# Patient Record
Sex: Male | Born: 1990 | Race: White | Hispanic: No | Marital: Single | Smoking: Never smoker
Health system: Southern US, Community
[De-identification: ages and names within clinical notes are randomized; demographics above are authoritative.]

## PROBLEM LIST (undated history)

## (undated) DIAGNOSIS — J45909 Unspecified asthma, uncomplicated: Secondary | ICD-10-CM

---

## 2015-03-21 ENCOUNTER — Encounter: Payer: Self-pay | Admitting: Emergency Medicine

## 2015-03-21 ENCOUNTER — Emergency Department
Admission: EM | Admit: 2015-03-21 | Discharge: 2015-03-21 | Disposition: A | Discharge status: Home / Self Care | Payer: BLUE CROSS/BLUE SHIELD | Attending: Emergency Medicine | Admitting: Emergency Medicine

## 2015-03-21 ENCOUNTER — Emergency Department: Payer: BLUE CROSS/BLUE SHIELD

## 2015-03-21 DIAGNOSIS — R509 Fever, unspecified: Secondary | ICD-10-CM | POA: Insufficient documentation

## 2015-03-21 DIAGNOSIS — R410 Disorientation, unspecified: Secondary | ICD-10-CM | POA: Insufficient documentation

## 2015-03-21 DIAGNOSIS — R519 Headache, unspecified: Secondary | ICD-10-CM

## 2015-03-21 DIAGNOSIS — R51 Headache: Secondary | ICD-10-CM | POA: Diagnosis present

## 2015-03-21 DIAGNOSIS — M542 Cervicalgia: Secondary | ICD-10-CM | POA: Diagnosis not present

## 2015-03-21 HISTORY — DX: Unspecified asthma, uncomplicated: J45.909

## 2015-03-21 LAB — CBC WITH DIFFERENTIAL/PLATELET
BASOS PCT: 1 %
Basophils Absolute: 0 10*3/uL (ref 0–0.1)
EOS ABS: 0 10*3/uL (ref 0–0.7)
EOS PCT: 1 %
HCT: 48.8 % (ref 40.0–52.0)
Hemoglobin: 16.8 g/dL (ref 13.0–18.0)
LYMPHS ABS: 1.3 10*3/uL (ref 1.0–3.6)
Lymphocytes Relative: 23 %
MCH: 29 pg (ref 26.0–34.0)
MCHC: 34.3 g/dL (ref 32.0–36.0)
MCV: 84.4 fL (ref 80.0–100.0)
MONOS PCT: 10 %
Monocytes Absolute: 0.5 10*3/uL (ref 0.2–1.0)
NEUTROS PCT: 67 %
Neutro Abs: 3.7 10*3/uL (ref 1.4–6.5)
PLATELETS: 198 10*3/uL (ref 150–440)
RBC: 5.78 MIL/uL (ref 4.40–5.90)
RDW: 12.8 % (ref 11.5–14.5)
WBC: 5.6 10*3/uL (ref 3.8–10.6)

## 2015-03-21 LAB — BASIC METABOLIC PANEL
Anion gap: 9 (ref 5–15)
BUN: 12 mg/dL (ref 6–20)
CO2: 27 mmol/L (ref 22–32)
CREATININE: 1.06 mg/dL (ref 0.61–1.24)
Calcium: 9.6 mg/dL (ref 8.9–10.3)
Chloride: 101 mmol/L (ref 101–111)
GFR calc non Af Amer: >60 mL/min (ref >60)
Glucose, Bld: 95 mg/dL (ref 65–99)
Potassium: 4.1 mmol/L (ref 3.5–5.1)
SODIUM: 137 mmol/L (ref 135–145)

## 2015-03-21 MED ORDER — BUTALBITAL-APAP-CAFFEINE 50-325-40 MG PO TABS: 1.0000 | ORAL_TABLET | Freq: Four times a day (QID) | ORAL | Status: AC | PRN

## 2015-03-21 MED ORDER — KETOROLAC TROMETHAMINE 30 MG/ML IJ SOLN
30.0000 mg | Freq: Once | INTRAMUSCULAR | Status: AC
  Administered 2015-03-21: 30 mg via INTRAVENOUS
  Filled 2015-03-21: qty 1

## 2015-03-21 NOTE — ED Notes (Addendum)
Presents with headache and some confusion  Sensitivity to light and soreness in neck  States sx's started about 3 days ago  Possible fever at that time. Denies any n/v/d   Pt is able to turn head from side to side and good flexion/extension noted

## 2015-03-21 NOTE — Discharge Instructions (Signed)

## 2015-03-21 NOTE — ED Provider Notes (Signed)
[facility] Emergency Department Provider Note  ____________________________________________  Time seen: Approximately 3:42 PM  I have reviewed the triage vital signs and the nursing notes.   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 Patient states he has some sensitivity to light soreness and neck. Symptoms started approximately 3 days ago with a possible fever at that time. There is been a recent case of viral meningitis at the university. Denies any nausea vomiting diarrhea. States that he can move his head side-to-side with good flexion and extension of his neck feels "sore when he turns laterally..Feels different than any headache he's ever had before. He described his pain as an 8/10 2 days ago at 6/10 right now. Is not requesting pain medications at this time.   Past Medical History  Diagnosis Date  . Asthma     There are no active problems to display for this patient.   History reviewed. No pertinent past surgical history.  Current Outpatient Rx  Name  Route  Sig  Dispense  Refill  . butalbital-acetaminophen-caffeine (FIORICET) 50-325-40 MG tablet   Oral   Take 1-2 tablets by mouth every 6 (six) hours as needed for headache.   20 tablet   0     Allergies Review of patient's allergies indicates no known allergies.  No family history on file.  Social History Social History  Substance Use Topics  . Smoking status: Never Smoker   . Smokeless tobacco: None  . Alcohol Use: Yes    Review of Systems Constitutional: Positive subjective fever/chills 2 days ago Eyes: No visual changes, has photophobia ENT: No sore throat. Cardiovascular: Denies chest pain. Respiratory: Denies shortness of breath. Gastrointestinal: No abdominal pain.  No nausea, no vomiting.  No diarrhea.  No constipation. Genitourinary: Negative for  dysuria. Musculoskeletal: Negative for back pain. Positive for neck pain Skin: Negative for rash. Neurological: Positive for headaches, .  10-point ROS otherwise negative.  ____________________________________________   PHYSICAL EXAM:  VITAL SIGNS: ED Triage Vitals  Enc Vitals Group     BP 03/21/15 1531 140/72 mmHg     Pulse Rate 03/21/15 1531 79     Resp 03/21/15 1531 18     Temp 03/21/15 1531 97.6 F (36.4 C)     Temp Source 03/21/15 1531 Oral     SpO2 03/21/15 1531 98 %     Weight 03/21/15 1531 130 lb (58.968 kg)     Height 03/21/15 1531 5\' 6"  (1.676 m)     Head Cir --      Peak Flow --      Pain Score 03/21/15 1532 6     Pain Loc --      Pain Edu? --      Excl. in GC? --     Constitutional: Alert and oriented. Well appearing and in no acute distress. Eyes: Conjunctivae are normal. PERRL. EOMI. Head: Atraumatic. Nose: No congestion/rhinnorhea. Mouth/Throat: Mucous membranes are moist.  Oropharynx non-erythematous. Neck: No stridor. Full range of motion with increased pain with lateralization to the left and right able to flex and extend the neck without difficulty   Cardiovascular: Normal rate, regular rhythm. Grossly normal heart sounds.  Good peripheral circulation. Respiratory: Normal respiratory effort.  No retractions. Lungs CTAB. Musculoskeletal: No lower extremity tenderness nor edema.  No joint effusions. Neurologic:  Normal speech and language. No gross focal neurologic deficits are  appreciated. No gait instability. Skin:  Skin is warm, dry and intact. No rash noted. Psychiatric: Mood and affect are normal. Speech and behavior are normal.  ____________________________________________   LABS (all labs ordered are listed, but only abnormal results are displayed)  Labs Reviewed  CBC WITH DIFFERENTIAL/PLATELET  BASIC METABOLIC PANEL    ____________________________________________  EKG  Deferred ____________________________________________  RADIOLOGY  CT negative ____________________________________________   PROCEDURES  Procedure(s) performed: None  Critical Care performed: No  ____________________________________________   INITIAL IMPRESSION / ASSESSMENT AND PLAN / ED COURSE  Pertinent labs & imaging results that were available during my care of the patient were reviewed by me and considered in my medical decision making (see chart for details).  Reviewed all lab work and radiological findings and determine patient have a headache of unknown cause. Reassurance provided to the patient that it probably was not meningitis. Rx given for Fioricet as needed for the headache and return to the ED with any worsening or newly developing symptoms. Patient With diagnosis and willing to go home and follow-up as needed. ____________________________________________   FINAL CLINICAL IMPRESSION(S) / ED DIAGNOSES  Final diagnoses:  Headache disorder     This chart was dictated using voice recognition software/Dragon. Despite best efforts to proofread, errors can occur which can change the meaning. Any change was purely unintentional.   Evangeline Dakin, PA-C 03/21/15 1722  Jeanmarie Plant, MD 03/21/15 2055

## 2017-06-20 IMAGING — CT CT HEAD W/O CM
1 series · 16 of 30 positions shown, 20 images · non-contrast
Comparison: None.

CLINICAL DATA: Headache, confusion and sensitivity to light for 3
days. Initial encounter.

EXAM:
CT HEAD WITHOUT CONTRAST
TECHNIQUE: Contiguous axial images were obtained from the base of the skull
through the vertex without intravenous contrast.

[Series 2: head wo · axial · 0.42mm/px · z∈[-54,+79]mm · 16 of 30 slices shown, 20 images]
[im 2/30  brain]
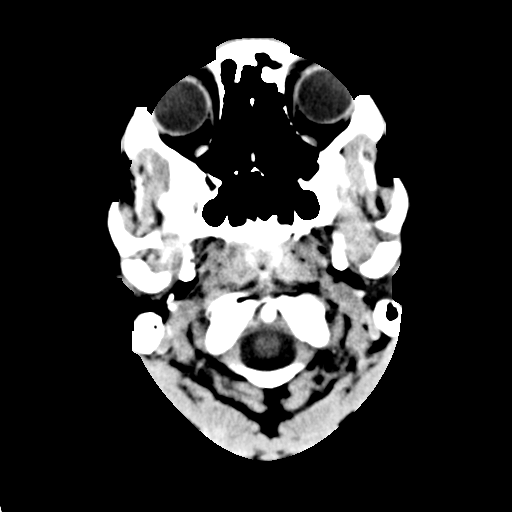
[im 2/30  bone]
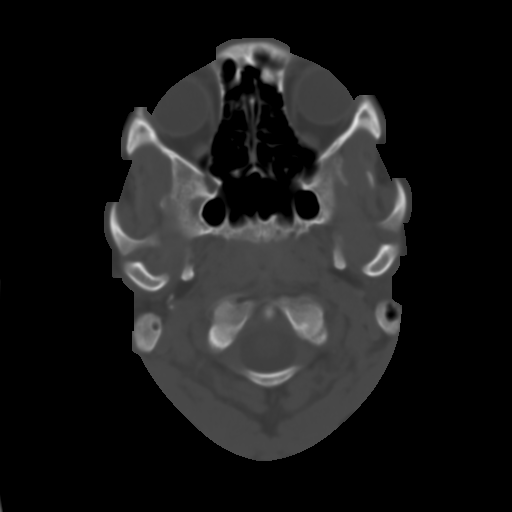
[im 4/30  brain]
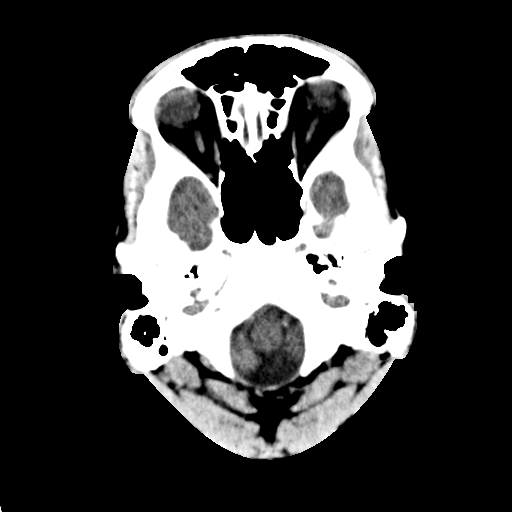
[im 6/30  brain]
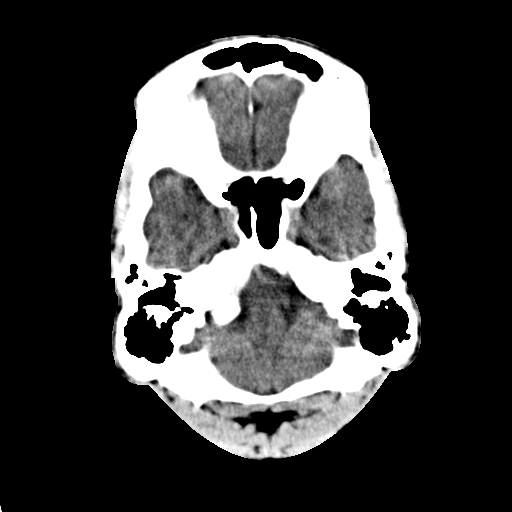
[im 8/30  brain]
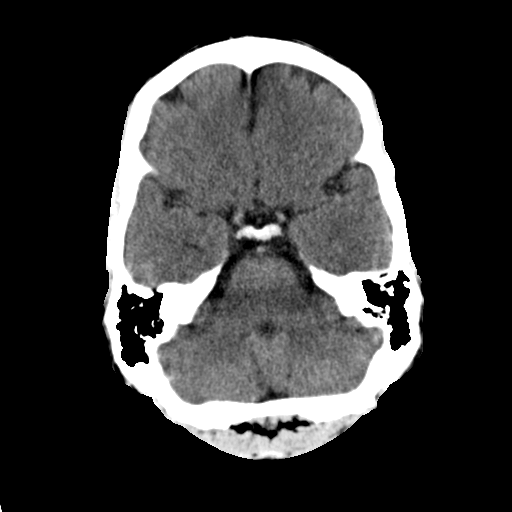
[im 9/30  brain]
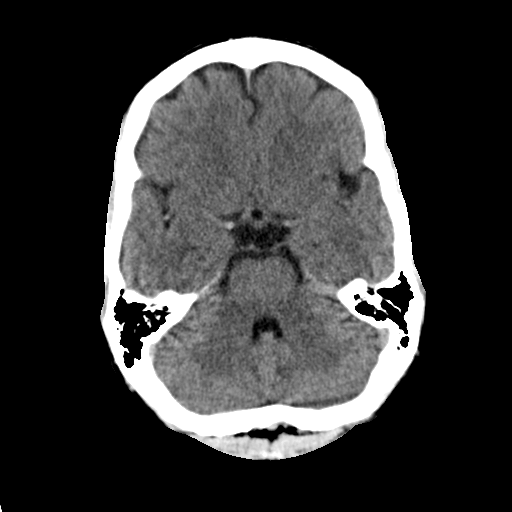
[im 9/30  bone]
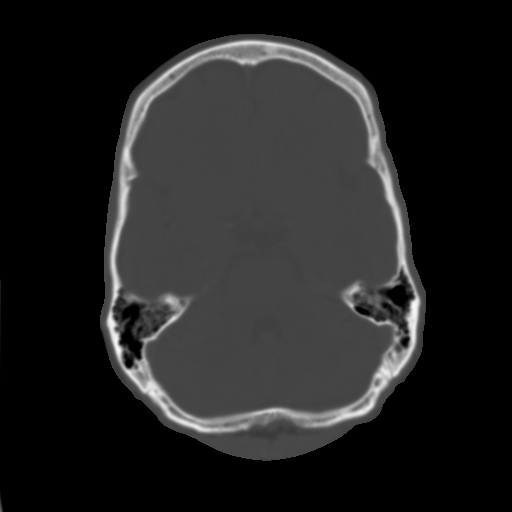
[im 11/30  brain]
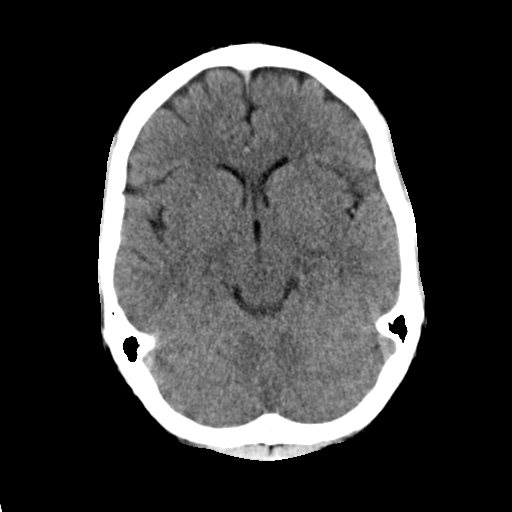
[im 13/30  brain]
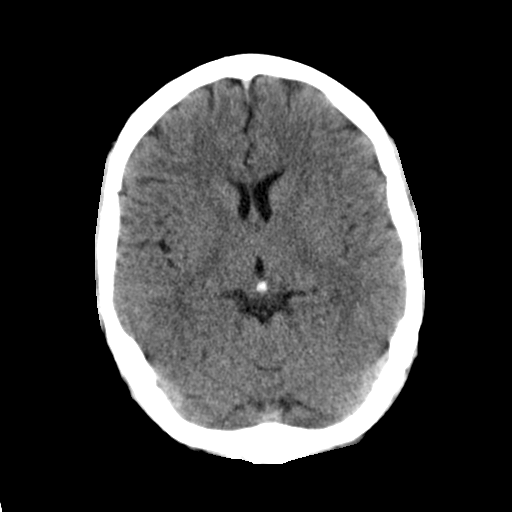
[im 15/30  brain]
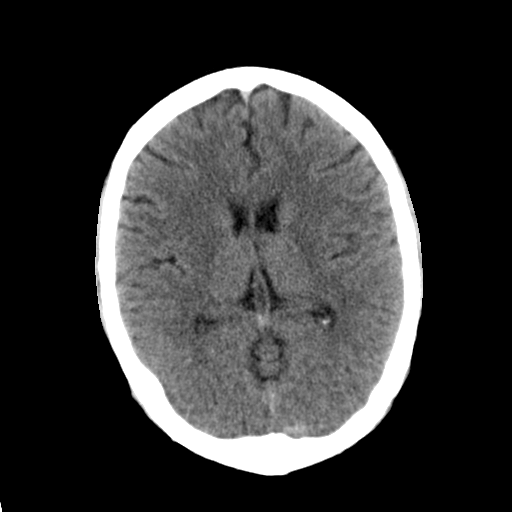
[im 16/30  brain]
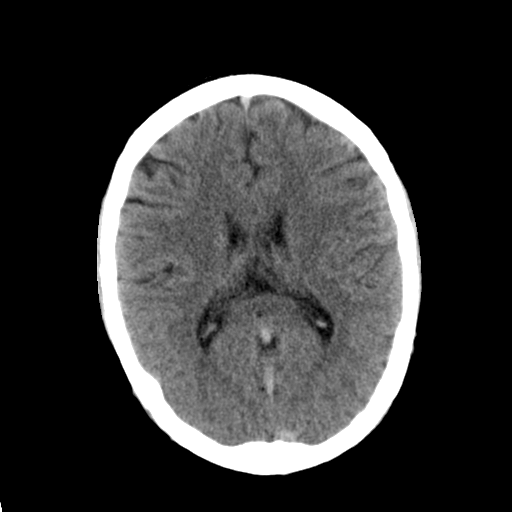
[im 16/30  bone]
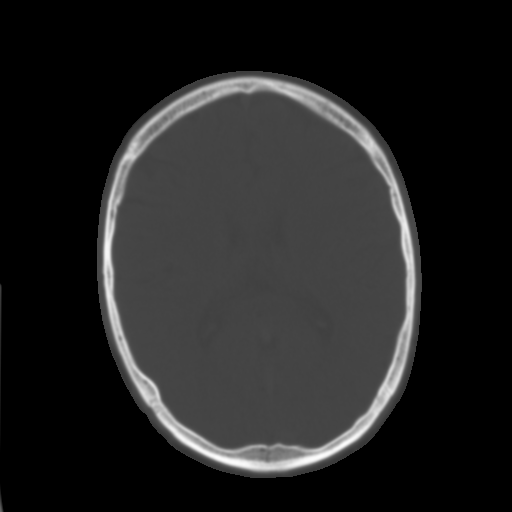
[im 18/30  brain]
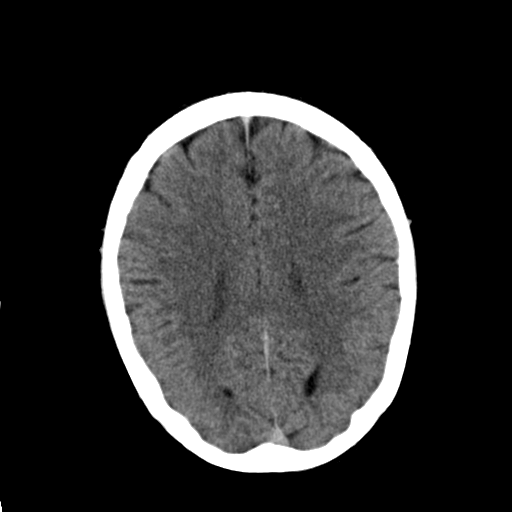
[im 20/30  brain]
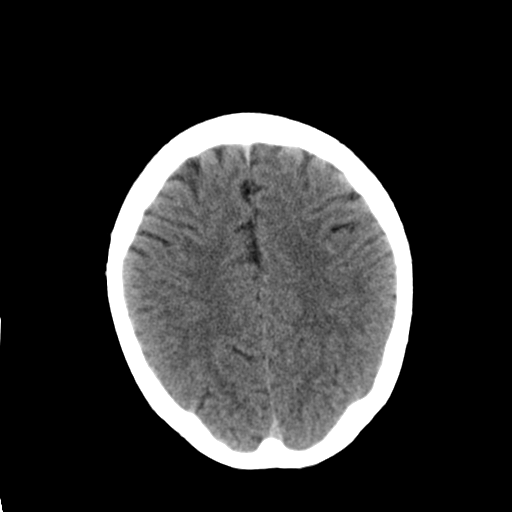
[im 22/30  brain]
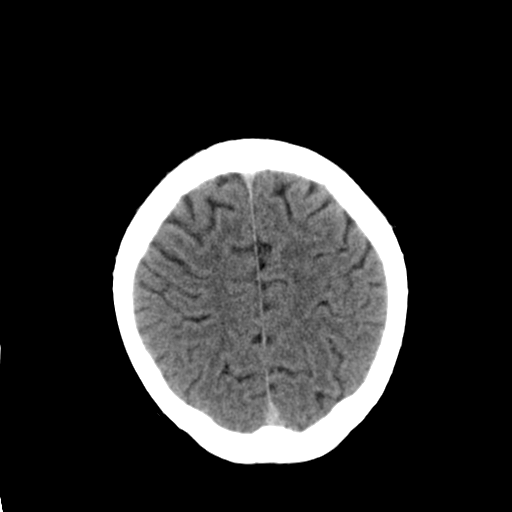
[im 23/30  brain]
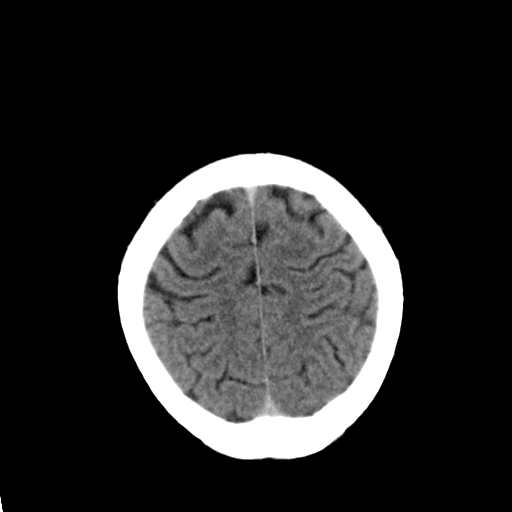
[im 23/30  bone]
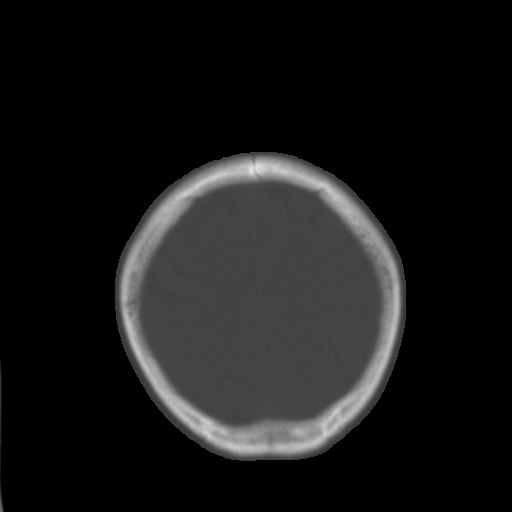
[im 25/30  brain]
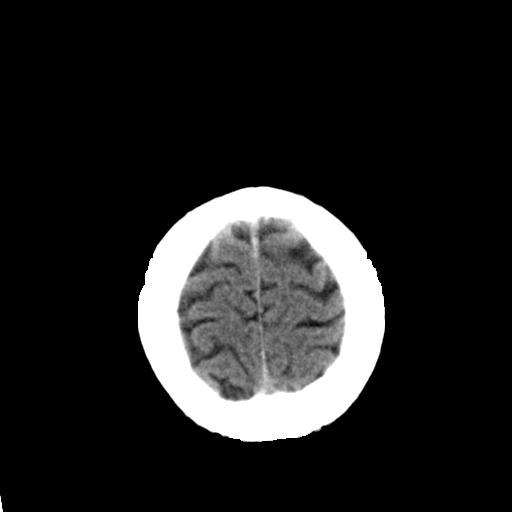
[im 27/30  brain]
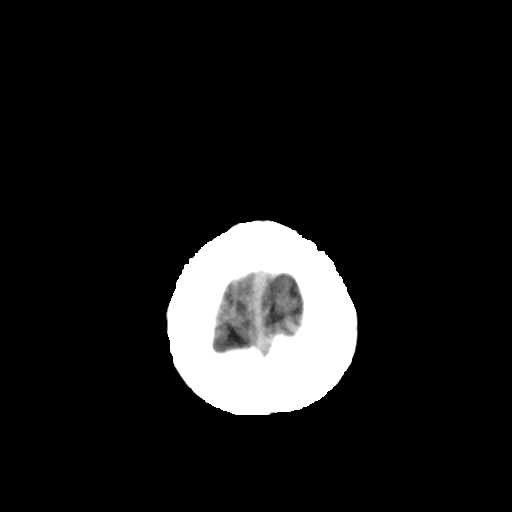
[im 29/30  brain]
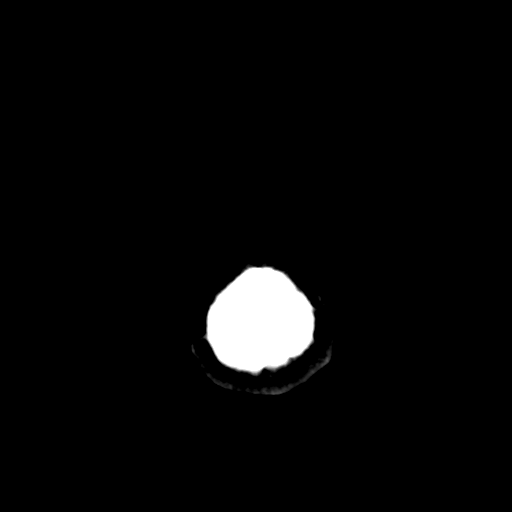

[16 of 30 positions shown; findings below may reference images not displayed]

FINDINGS: The brain appears normal without hemorrhage, infarct, mass lesion,
mass effect, midline shift or abnormal extra-axial fluid collection.
There is no hydrocephalus or pneumocephalus. The calvarium is
intact. Imaged paranasal sinuses mastoid air cells are clear.
IMPRESSION: Negative head CT.
